# Patient Record
Sex: Female | Born: 1950 | Race: White | Hispanic: No | Marital: Married
Health system: Southern US, Community
[De-identification: ages and names within clinical notes are randomized; demographics above are authoritative.]

## PROBLEM LIST (undated history)

## (undated) DIAGNOSIS — C801 Malignant (primary) neoplasm, unspecified: Secondary | ICD-10-CM

## (undated) DIAGNOSIS — E119 Type 2 diabetes mellitus without complications: Secondary | ICD-10-CM

## (undated) HISTORY — PX: KIDNEY SURGERY: SHX687

---

## 2020-03-23 ENCOUNTER — Other Ambulatory Visit: Payer: Self-pay

## 2020-03-23 ENCOUNTER — Encounter (HOSPITAL_COMMUNITY): Payer: Self-pay | Admitting: Emergency Medicine

## 2020-03-23 ENCOUNTER — Emergency Department (HOSPITAL_COMMUNITY): Payer: Medicare Other

## 2020-03-23 ENCOUNTER — Emergency Department (HOSPITAL_COMMUNITY)
Admission: EM | Admit: 2020-03-23 | Discharge: 2020-03-23 | Disposition: A | Discharge status: Home / Self Care | Payer: Medicare Other | Attending: Emergency Medicine | Admitting: Emergency Medicine

## 2020-03-23 DIAGNOSIS — Y92481 Parking lot as the place of occurrence of the external cause: Secondary | ICD-10-CM | POA: Diagnosis not present

## 2020-03-23 DIAGNOSIS — Z859 Personal history of malignant neoplasm, unspecified: Secondary | ICD-10-CM | POA: Insufficient documentation

## 2020-03-23 DIAGNOSIS — S39012A Strain of muscle, fascia and tendon of lower back, initial encounter: Secondary | ICD-10-CM | POA: Diagnosis not present

## 2020-03-23 DIAGNOSIS — W010XXA Fall on same level from slipping, tripping and stumbling without subsequent striking against object, initial encounter: Secondary | ICD-10-CM | POA: Insufficient documentation

## 2020-03-23 DIAGNOSIS — Y9301 Activity, walking, marching and hiking: Secondary | ICD-10-CM | POA: Diagnosis not present

## 2020-03-23 DIAGNOSIS — Y998 Other external cause status: Secondary | ICD-10-CM | POA: Diagnosis not present

## 2020-03-23 DIAGNOSIS — S3992XA Unspecified injury of lower back, initial encounter: Secondary | ICD-10-CM | POA: Diagnosis present

## 2020-03-23 DIAGNOSIS — E119 Type 2 diabetes mellitus without complications: Secondary | ICD-10-CM | POA: Insufficient documentation

## 2020-03-23 DIAGNOSIS — W19XXXA Unspecified fall, initial encounter: Secondary | ICD-10-CM

## 2020-03-23 HISTORY — DX: Type 2 diabetes mellitus without complications: E11.9

## 2020-03-23 HISTORY — DX: Malignant (primary) neoplasm, unspecified: C80.1

## 2020-03-23 NOTE — Discharge Instructions (Addendum)
You will need an outpatient CT scan in 3-4 months to follow-up on the nonspecific abnormality of your mesentery seen on the CT scan today.  Your primary care physician can set this up.  If you develop worsening, recurrent, or continued back pain, numbness or weakness in the legs, incontinence of your bowels or bladders, numbness of your buttocks, fever, abdominal pain, or any other new/concerning symptoms then return to the ER for evaluation.

## 2020-03-23 NOTE — ED Provider Notes (Signed)
[facility] EMERGENCY DEPARTMENT Provider Note   CSN: [id number] Arrival date & time: 03/23/20  1131     History Chief Complaint  Patient presents with  . Fall    Cassie Spence is a 69 y.o. female.  HPI 69 year old female presents with a fall.  Occurred around 10 AM this morning.  She was in a parking lot and did not see a speed bump and tripped over it.  Hit her hands but then also twisted and hit her right flank.  She has a previous right nephrectomy and is concerned about damage to this area.  Has been having pain over her right flank since.  Urinated and did not have any blood.  Scraped her elbow but states there is no significant pain here.  She is unsure of when her last tetanus immunization was but thinks it might have been within the last couple years.  No leg weakness or numbness.  She is able to ambulate.  No incontinence. It is painful in her back to move and twist.   Past Medical History:  Diagnosis Date  . Cancer ([place])   . Diabetes mellitus without complication ([place])    type 2    There are no problems to display for this patient.      OB History   No obstetric history on file.     No family history on file.  Social History   Tobacco Use  . Smoking status: Not on file  Substance Use Topics  . Alcohol use: Not on file  . Drug use: Not on file    Home Medications Prior to Admission medications   Not on File    Allergies    Amoxicillin and Augmentin [amoxicillin-pot clavulanate]  Review of Systems   Review of Systems  Gastrointestinal: Negative for abdominal pain.  Genitourinary: Negative for hematuria.  Musculoskeletal: Positive for back pain. Negative for arthralgias.  Skin: Positive for wound.  Neurological: Negative for weakness and numbness.  All other systems reviewed and are negative.   Physical Exam Updated Vital Signs BP (!) 166/76   Pulse 70   Temp 97.6 F (36.4 C) (Oral)   Resp 16   Ht 5\' 4"  (1.626 m)    Wt 103 kg   SpO2 100%   BMI 38.96 kg/m   Physical Exam Vitals and nursing note reviewed.  Constitutional:      General: She is not in acute distress.    Appearance: She is well-developed. She is obese. She is not ill-appearing or diaphoretic.  HENT:     Head: Normocephalic and atraumatic.     Right Ear: External ear normal.     Left Ear: External ear normal.     Nose: Nose normal.  Eyes:     General:        Right eye: No discharge.        Left eye: No discharge.  Cardiovascular:     Rate and Rhythm: Normal rate and regular rhythm.     Heart sounds: Normal heart sounds.  Pulmonary:     Effort: Pulmonary effort is normal.     Breath sounds: Normal breath sounds.  Abdominal:     Palpations: Abdomen is soft.     Tenderness: There is no abdominal tenderness.  Musculoskeletal:     Comments: Small abrasions to bilateral palms. Small abrasion to right elbow. Normal ROM, no tenderness or swelling Mild midline thoracic tenderness in mid-T-spine tenderness overlying well healed right nephrectomy/flank scar  Skin:  General: Skin is warm and dry.  Neurological:     Mental Status: She is alert.     Comments: 5/5 strength in BLE.   Psychiatric:        Mood and Affect: Mood is not anxious.     ED Results / Procedures / Treatments   Labs (all labs ordered are listed, but only abnormal results are displayed) Labs Reviewed - No data to display  EKG None  Radiology CT Renal Stone Study  Result Date: 03/23/2020 CLINICAL DATA:  Pain following fall EXAM: CT ABDOMEN AND PELVIS WITHOUT CONTRAST TECHNIQUE: Multidetector CT imaging of the abdomen and pelvis was performed following the standard protocol without oral or IV contrast. COMPARISON:  None. FINDINGS: Lower chest: There is a 3 mm calcified granuloma in the superior segment of the right lower lobe. Lung bases otherwise are clear. No basilar pneumothorax evident. Hepatobiliary: No liver lesions are evident on this noncontrast  enhanced study. There is no perihepatic fluid. Gallbladder wall is not appreciably thickened. There is no biliary duct dilatation. Pancreas: There is no pancreatic mass or inflammatory focus. No peripancreatic fluid evident. Spleen: No splenic lesions evident on this noncontrast enhanced study. No perisplenic fluid. Adrenals/Urinary Tract: Adrenals bilaterally appear normal. The patient is status post right nephrectomy. No mass or fluid seen in the right pararenal fossa. There is no left renal perinephric stranding. There is an extrarenal pelvis on the left, an anatomic variant. There are apparent parapelvic cysts on the left, largest measuring 1.7 x 1.4 cm. There is no hydronephrosis on the left. No left-sided renal or ureteral calculus evident. Urinary bladder is midline with wall thickness within normal limits for nearly empty state. Stomach/Bowel: There is no appreciable bowel wall or mesenteric thickening. No evident bowel obstruction. The terminal ileum appears normal. There is no evident free air or portal venous air. Vascular/Lymphatic: No abdominal aortic aneurysm. There are scattered foci of aortic atherosclerosis. No periaortic fluid. No evident adenopathy in the abdomen or pelvis. Reproductive: Uterus is anteverted.  No evident pelvic mass. Other: No periappendiceal region inflammation evident. No abscess or ascites noted in the abdomen or pelvis. There is slight mesenteric thickening in the mid abdomen without bowel or vascular disruption. This area of rather vague mesenteric thickening extends over an area of approximately 8.5 x 6 cm. Musculoskeletal: No fracture or dislocation evident. There are foci of degenerative change in the lower thoracic and lumbar spine regions. No blastic or lytic bone lesions. No intramuscular or abdominal wall lesions. IMPRESSION: 1. Rather vague mid abdominal mesenteric stranding and slight thickening without associated adenopathy. No disruption of bowel or vascularity  from this rather vague mesenteric stranding. This finding potentially could have posttraumatic etiology. More likely, however, this finding represents mild sclerosing mesenteritis. Given this finding, a follow-up CT in 3-4 months to assess for stability is advised. 2. Right kidney absent. No lesions seen in the right pararenal fossa region. 3. No evident bowel wall thickening or bowel obstruction. No abscess or abnormal fluid collection in the abdomen or pelvis. Viscera appear intact on this noncontrast enhanced study. 4.  Aortic Atherosclerosis (ICD10-I70.0). Electronically Signed   By: Lowella Grip III M.D.   On: 03/23/2020 13:13    Procedures Procedures (including critical care time)  Medications Ordered in ED Medications - No data to display  ED Course  I have reviewed the triage vital signs and the nursing notes.  Pertinent labs & imaging results that were available during my care of the patient were reviewed  by me and considered in my medical decision making (see chart for details).    MDM Rules/Calculators/A&P                      CT images personally reviewed.  This CT scan was taken from triage given patient's high concern for injury to where she had her right nephrectomy.  External exam is also unremarkable besides muscular tenderness.  I think this is all superficial/soft tissue injury.  Highly doubt acute spinal cord emergency and there is minimal midline pain with no fracture seen.  Neuro exam unremarkable.  We discussed return precautions.  She has no abdominal complaints and abdominal exam is benign.  The nonspecific mesentery abnormality will need to be followed up as an outpatient with CT and she has been made aware of this. Final Clinical Impression(s) / ED Diagnoses Final diagnoses:  Fall, initial encounter  Back strain, initial encounter    Rx / DC Orders ED Discharge Orders    None       Sherwood Gambler, MD 03/24/20 [phone]

## 2020-03-23 NOTE — ED Triage Notes (Addendum)
Patient states she was parking in parking deck looking for her car when she tripped on a speed bump causing her to land on both arms, scabbed right elbow but has good range of motion. Patient hit right side of back on pavement and now having pain.  Denies any LOC.

## 2020-03-23 NOTE — ED Notes (Signed)
Pt more comfortable sitting on edge of bed, able to ambulate around room

## 2020-10-16 IMAGING — CT CT RENAL STONE PROTOCOL
2 of 3 series · 12 of 36 positions shown, 17 images · non-contrast
Comparison: None.

CLINICAL DATA: Pain following fall

EXAM:
CT ABDOMEN AND PELVIS WITHOUT CONTRAST
TECHNIQUE: Multidetector CT imaging of the abdomen and pelvis was performed
following the standard protocol without oral or IV contrast.

[Series 3: ap without · axial · non-contrast · 0.83mm/px · z∈[+197,+597]mm · 11 of 90 slices shown, 15 images]
[im 5/90  soft-tissue]
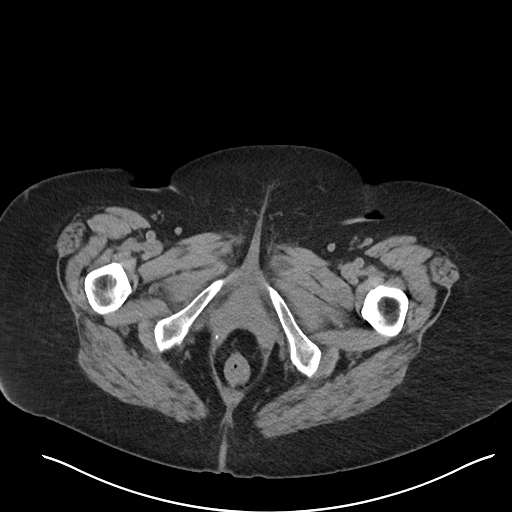
[im 5/90  bone]
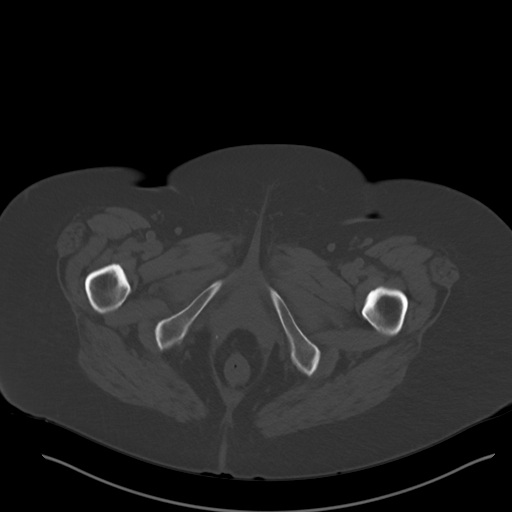
[im 15/90  soft-tissue]
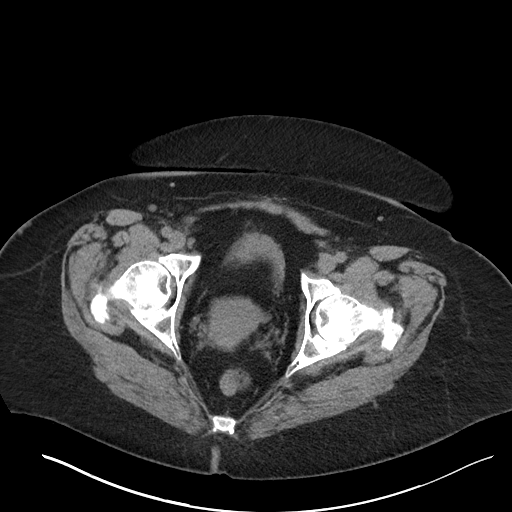
[im 25/90  soft-tissue]
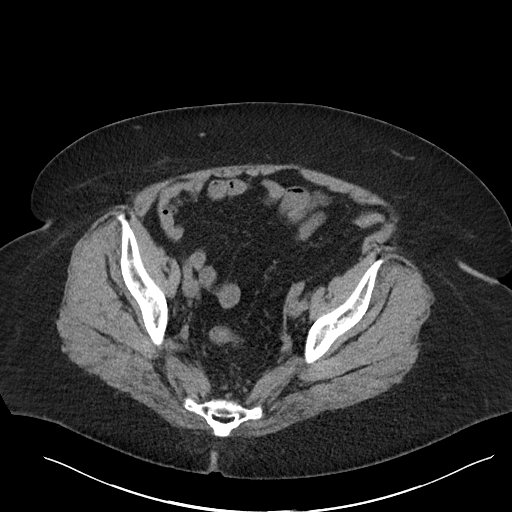
[im 35/90  soft-tissue]
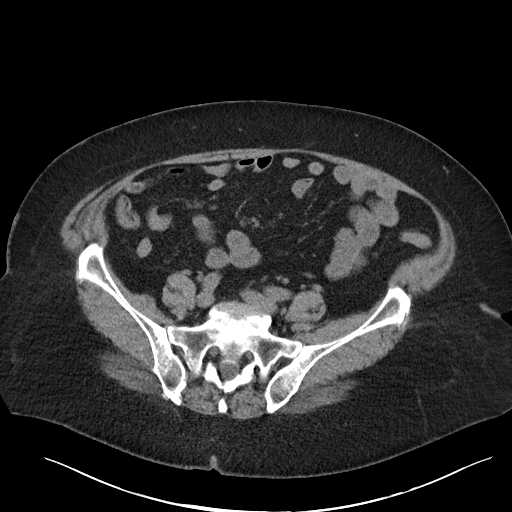
[im 45/90  soft-tissue]
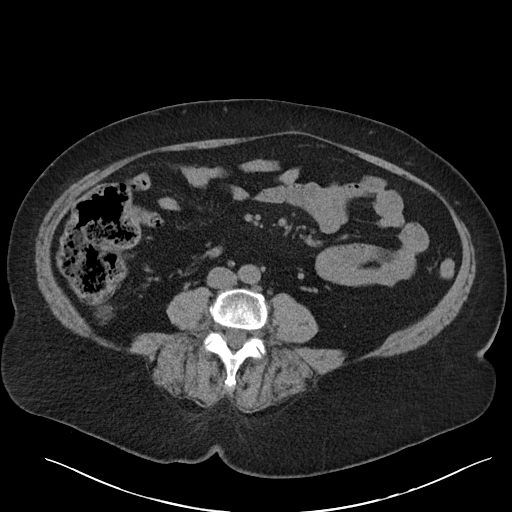
[im 55/90  soft-tissue]
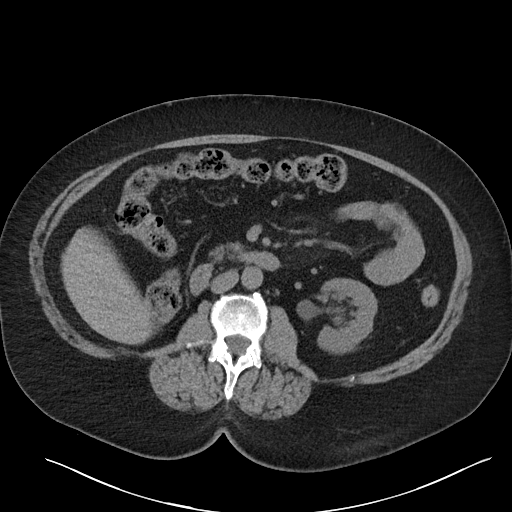
[im 65/90  soft-tissue]
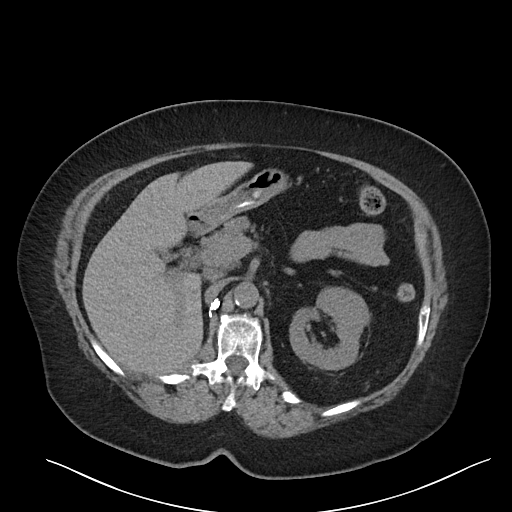
[im 70/90  lung]
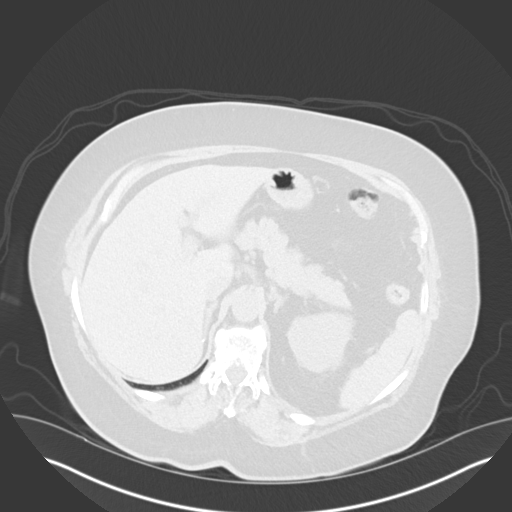
[im 75/90  soft-tissue]
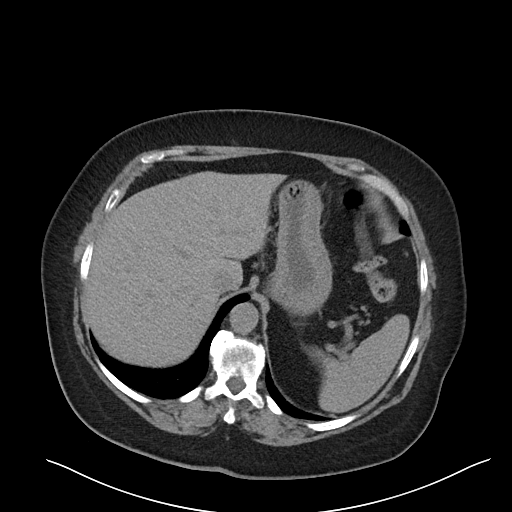
[im 75/90  lung]
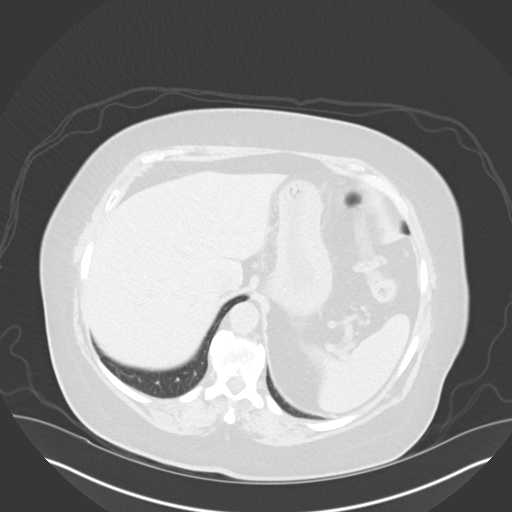
[im 80/90  lung]
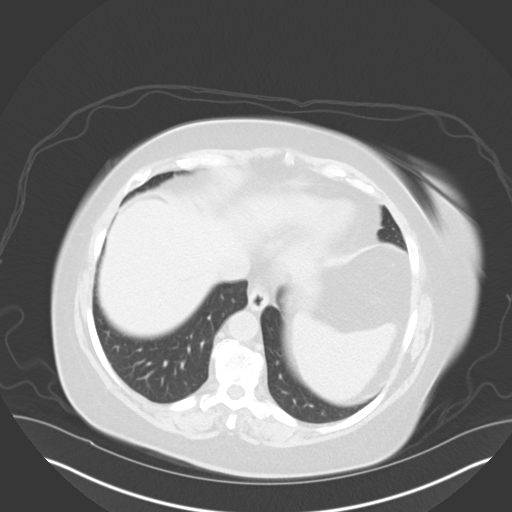
[im 85/90  soft-tissue]
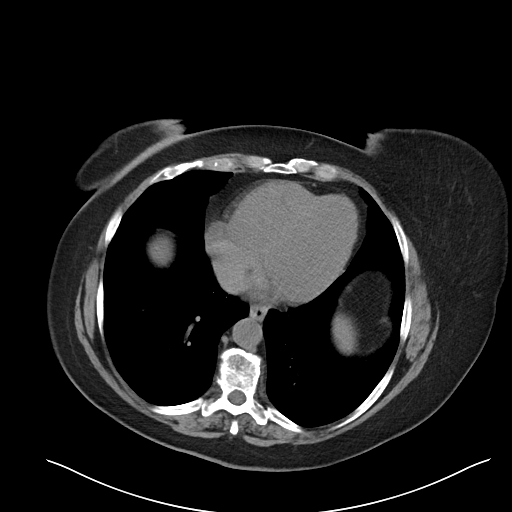
[im 85/90  lung]
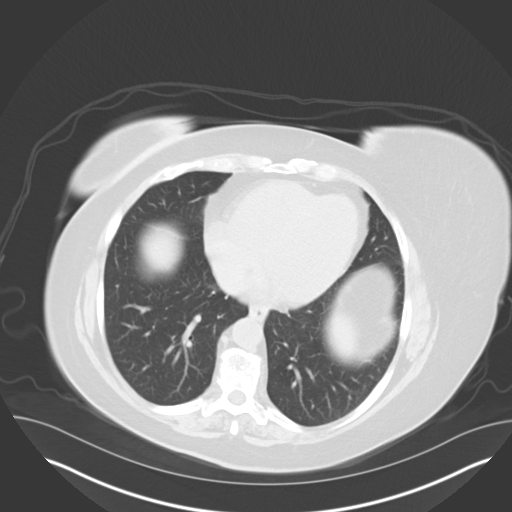
[im 85/90  bone]
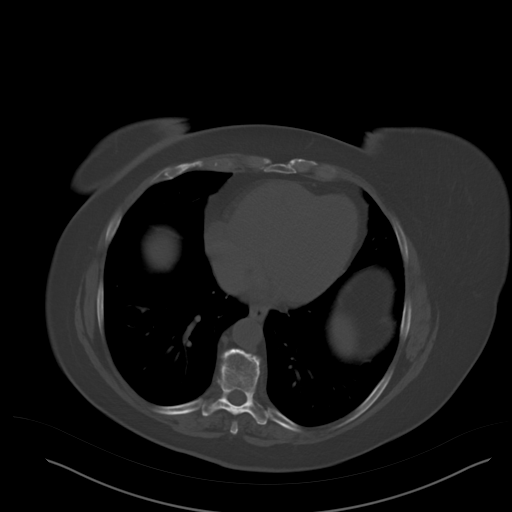

[Series 7: sag · sagittal · 0.63mm/px · 1 of 132 slices shown, 2 images]
[im 44/132  soft-tissue]
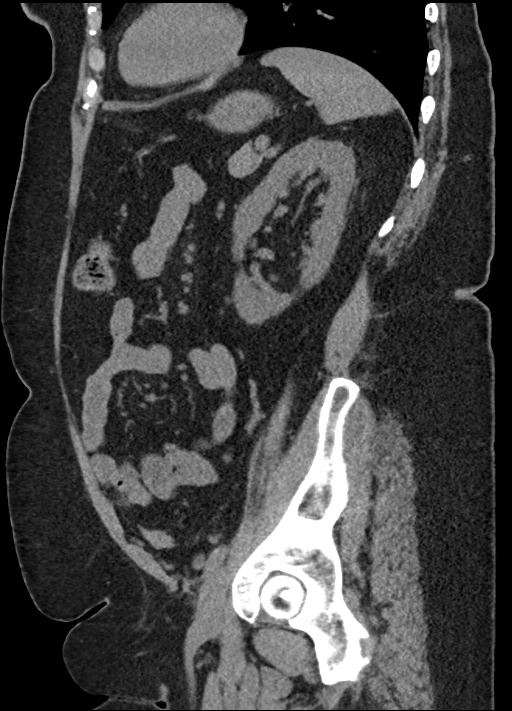
[im 44/132  bone]
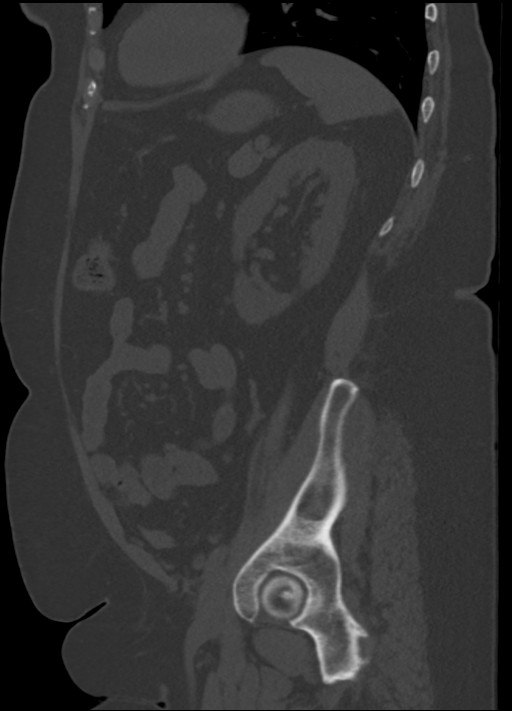

[12 of 36 positions shown; findings below may reference images not displayed]

FINDINGS: Lower chest: There is a 3 mm calcified granuloma in the superior
segment of the right lower lobe. Lung bases otherwise are clear. No
basilar pneumothorax evident.

Hepatobiliary: No liver lesions are evident on this noncontrast
enhanced study. There is no perihepatic fluid. Gallbladder wall is
not appreciably thickened. There is no biliary duct dilatation.

Pancreas: There is no pancreatic mass or inflammatory focus. No
peripancreatic fluid evident.

Spleen: No splenic lesions evident on this noncontrast enhanced
study. No perisplenic fluid.

Adrenals/Urinary Tract: Adrenals bilaterally appear normal. The
patient is status post right nephrectomy. No mass or fluid seen in
the right pararenal fossa. There is no left renal perinephric
stranding. There is an extrarenal pelvis on the left, an anatomic
variant. There are apparent parapelvic cysts on the left, largest
measuring 1.7 x 1.4 cm. There is no hydronephrosis on the left. No
left-sided renal or ureteral calculus evident. Urinary bladder is
midline with wall thickness within normal limits for nearly empty
state.

Stomach/Bowel: There is no appreciable bowel wall or mesenteric
thickening. No evident bowel obstruction. The terminal ileum appears
normal. There is no evident free air or portal venous air.

Vascular/Lymphatic: No abdominal aortic aneurysm. There are
scattered foci of aortic atherosclerosis. No periaortic fluid. No
evident adenopathy in the abdomen or pelvis.

Reproductive: Uterus is anteverted.  No evident pelvic mass.

Other: No periappendiceal region inflammation evident. No abscess or
ascites noted in the abdomen or pelvis. There is slight mesenteric
thickening in the mid abdomen without bowel or vascular disruption.
This area of rather vague mesenteric thickening extends over an area
of approximately 8.5 x 6 cm.

Musculoskeletal: No fracture or dislocation evident. There are foci
of degenerative change in the lower thoracic and lumbar spine
regions. No blastic or lytic bone lesions. No intramuscular or
abdominal wall lesions.
IMPRESSION: 1. Rather vague mid abdominal mesenteric stranding and slight
thickening without associated adenopathy. No disruption of bowel or
vascularity from this rather vague mesenteric stranding. This
finding potentially could have posttraumatic etiology. More likely,
however, this finding represents mild sclerosing mesenteritis. Given
this finding, a follow-up CT in 3-4 months to assess for stability
is advised.

2. Right kidney absent. No lesions seen in the right pararenal fossa
region.

3. No evident bowel wall thickening or bowel obstruction. No abscess
or abnormal fluid collection in the abdomen or pelvis. Viscera
appear intact on this noncontrast enhanced study.

4.  Aortic Atherosclerosis ([id number]).
